# Patient Record
Sex: Female | Born: 1990 | Race: Black or African American | Hispanic: No | Marital: Single | Smoking: Never smoker
Health system: Southern US, Community
[De-identification: ages and names within clinical notes are randomized; demographics above are authoritative.]

## PROBLEM LIST (undated history)

## (undated) HISTORY — PX: FEMUR SURGERY: SHX943

## (undated) HISTORY — PX: BACK SURGERY: SHX140

---
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
## 2017-10-26 ENCOUNTER — Ambulatory Visit
Admission: EM | Admit: 2017-10-26 | Discharge: 2017-10-26 | Disposition: A | Discharge status: Home / Self Care | Payer: Commercial Managed Care - PPO | Attending: Family Medicine | Admitting: Family Medicine

## 2017-10-26 ENCOUNTER — Ambulatory Visit (INDEPENDENT_AMBULATORY_CARE_PROVIDER_SITE_OTHER): Payer: Commercial Managed Care - PPO

## 2017-10-26 DIAGNOSIS — S29019A Strain of muscle and tendon of unspecified wall of thorax, initial encounter: Secondary | ICD-10-CM

## 2017-10-26 DIAGNOSIS — S161XXA Strain of muscle, fascia and tendon at neck level, initial encounter: Secondary | ICD-10-CM

## 2017-10-26 DIAGNOSIS — S29012A Strain of muscle and tendon of back wall of thorax, initial encounter: Secondary | ICD-10-CM | POA: Diagnosis not present

## 2017-10-26 MED ORDER — CYCLOBENZAPRINE HCL 5 MG PO TABS: 5.0000 mg | ORAL_TABLET | Freq: Every evening | ORAL | 0 refills | Status: AC | PRN

## 2017-10-26 NOTE — Discharge Instructions (Addendum)
Take medication as prescribed. Rest. Drink plenty of fluids.  Continue home over-the-counter ibuprofen as needed.  Stretch.  Follow up with your primary care physician this week as needed. Return to Urgent care for new or worsening concerns.
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
